# Patient Record
Sex: Female | Born: 1965 | Race: White | Hispanic: No | Marital: Married | Smoking: Never smoker
Health system: Southern US, Community
[De-identification: ages and names within clinical notes are randomized; demographics above are authoritative.]

## PROBLEM LIST (undated history)

## (undated) HISTORY — PX: ABDOMINAL HYSTERECTOMY: SHX81

## (undated) HISTORY — PX: OTHER SURGICAL HISTORY: SHX169

---

## 2014-01-03 ENCOUNTER — Ambulatory Visit (INDEPENDENT_AMBULATORY_CARE_PROVIDER_SITE_OTHER): Payer: 59

## 2014-01-03 VITALS — BP 131/77 | HR 74 | Resp 18

## 2014-01-03 DIAGNOSIS — M7732 Calcaneal spur, left foot: Secondary | ICD-10-CM

## 2014-01-03 DIAGNOSIS — M7730 Calcaneal spur, unspecified foot: Secondary | ICD-10-CM

## 2014-01-03 DIAGNOSIS — R52 Pain, unspecified: Secondary | ICD-10-CM

## 2014-01-03 DIAGNOSIS — M7660 Achilles tendinitis, unspecified leg: Secondary | ICD-10-CM

## 2014-01-03 MED ORDER — MELOXICAM 15 MG PO TABS: 15.0000 mg | ORAL_TABLET | Freq: Every day | ORAL | Status: AC

## 2014-01-03 NOTE — Patient Instructions (Signed)
ICE INSTRUCTIONS  Apply ice or cold pack to the affected area at least 3 times a day for 10-15 minutes each time.  You should also use ice after prolonged activity or vigorous exercise.  Do not apply ice longer than 20 minutes at one time.  Always keep a cloth between your skin and the ice pack to prevent burns.  Being consistent and following these instructions will help control your symptoms.  We suggest you purchase a gel ice pack because they are reusable and do bit leak.  Some of them are designed to wrap around the area.  Use the method that works best for you.  Here are some other suggestions for icing.   Use a frozen bag of peas or corn-inexpensive and molds well to your body, usually stays frozen for 10 to 20 minutes.  Wet a towel with cold water and squeeze out the excess until it's damp.  Place in a bag in the freezer for 20 minutes. Then remove and use.   Alternate applying a hot compresses such as a hot towel for 10 minutes and then the ice for 10 minutes repeat this 2 or 3 times at each session. Consider heat and ice middle of the day if possible and again at bedtime

## 2014-01-03 NOTE — Progress Notes (Signed)
   Subjective:    Patient ID: Nichole Mcgrath, female    DOB: 06/12/1965, 48 y.o.   MRN: [id number]  HPI THE BACK OF MY LEFT HEEL IS BOTHERING ME FOR ABOUT A WEEK NOW AND HURTS IN THE MORNING AND AT WORK DUE TO I WALK 8 TO 10 HOURS A DAY AND CAN'T HARDLY WALK AND DOES BURN AND THROB AND IS SORE AND TENDER AND THERE IS SOME SWELLING    Review of Systems  HENT: Positive for sinus pressure.   Musculoskeletal: Positive for gait problem.  Allergic/Immunologic: Positive for environmental allergies.  Neurological: Positive for headaches.  All other systems reviewed and are negative.      Objective:   Physical Exam 48 year old white female well-developed well-nourished oriented x3 presents this time with a history of pain posterior lateral heel varus and tenderness on palpation medial band of the plantar fascia hours the posterior heel Achilles tendon insertion is most significant patient as she remembers that she did some walking amusement park and starting the next day had pain could hardly walk in the morning and has had continued pain since then has tried icing and Tylenol or Advil for pain.  Lower extremity objective findings as follows vascular status is intact pedal pulses palpable DP and PT +2/4 bilateral capillary refill time 3 seconds epicritic and proprioceptive sensations intact and symmetric bilateral there is normal plantar response DTRs not elicited dermatologically skin color pigment normal hair growth absent nails unremarkable. Orthopedic exam rectus foot type ankle mid tarsal subtalar joint motions normal x-rays reveal well-developed inferior as well as retrocalcaneal spur within the tendon insertion there is some Achilles tendon thickening noted on lateral projections muscle fascial thickening noted no fractures no cysts lesions or tumors were identified on x-ray there is tenderness on dorsiflexion of the foot and ankle patient is currently wearing Crocs.       Assessment & Plan:    Assessment this time based on clinical  Findings is Achilles tendinitis/bursitis with retrocalcaneal spurring plan at this time rest and ice also warm compress ice pack to recommended elevating the heel wearing a wedge shoe or heel or crocs no barefoot no flimsy shoes or flip-flops and open back clog would be beneficial. Also at this time [facility] meloxicam once daily is prescribed to the patient's pharmacy followup in 2-3 weeks if no significant improvement moderate activities notable listed activities stay on level surfaces avoid going up or down hills or stairs whenever possible. Reappointed in 2-3 for reevaluation if no improvement consideration for mobilization the cam walker topical creams possibly the steroid. A steroid injection may be considered also for patient been some instances more invasive options including surgery could become an option if no improvement occurs and reevaluate with in 2-3 week  Alvan Dameichard Antonique Langford DPM

## 2014-01-24 ENCOUNTER — Ambulatory Visit: Payer: 59

## 2019-03-23 DIAGNOSIS — Z23 Encounter for immunization: Secondary | ICD-10-CM | POA: Diagnosis not present

## 2019-05-14 DIAGNOSIS — L4050 Arthropathic psoriasis, unspecified: Secondary | ICD-10-CM | POA: Diagnosis not present

## 2019-05-14 DIAGNOSIS — L400 Psoriasis vulgaris: Secondary | ICD-10-CM | POA: Diagnosis not present

## 2019-07-16 DIAGNOSIS — L400 Psoriasis vulgaris: Secondary | ICD-10-CM | POA: Diagnosis not present

## 2019-07-16 DIAGNOSIS — L299 Pruritus, unspecified: Secondary | ICD-10-CM | POA: Diagnosis not present

## 2019-09-14 DIAGNOSIS — R531 Weakness: Secondary | ICD-10-CM | POA: Diagnosis not present

## 2019-09-14 DIAGNOSIS — L400 Psoriasis vulgaris: Secondary | ICD-10-CM | POA: Diagnosis not present

## 2019-09-14 DIAGNOSIS — Z111 Encounter for screening for respiratory tuberculosis: Secondary | ICD-10-CM | POA: Diagnosis not present

## 2020-01-16 DIAGNOSIS — L301 Dyshidrosis [pompholyx]: Secondary | ICD-10-CM | POA: Diagnosis not present

## 2020-01-16 DIAGNOSIS — L4050 Arthropathic psoriasis, unspecified: Secondary | ICD-10-CM | POA: Diagnosis not present

## 2020-01-16 DIAGNOSIS — L400 Psoriasis vulgaris: Secondary | ICD-10-CM | POA: Diagnosis not present

## 2020-04-14 DIAGNOSIS — J209 Acute bronchitis, unspecified: Secondary | ICD-10-CM | POA: Diagnosis not present

## 2020-04-14 DIAGNOSIS — R0981 Nasal congestion: Secondary | ICD-10-CM | POA: Diagnosis not present

## 2021-02-06 DIAGNOSIS — R519 Headache, unspecified: Secondary | ICD-10-CM | POA: Diagnosis not present

## 2021-02-06 DIAGNOSIS — I10 Essential (primary) hypertension: Secondary | ICD-10-CM | POA: Diagnosis not present

## 2021-02-06 DIAGNOSIS — H9202 Otalgia, left ear: Secondary | ICD-10-CM | POA: Diagnosis not present

## 2021-02-06 DIAGNOSIS — H9201 Otalgia, right ear: Secondary | ICD-10-CM | POA: Diagnosis not present

## 2021-02-11 DIAGNOSIS — I160 Hypertensive urgency: Secondary | ICD-10-CM | POA: Diagnosis not present

## 2021-02-11 DIAGNOSIS — Z20822 Contact with and (suspected) exposure to covid-19: Secondary | ICD-10-CM | POA: Diagnosis not present

## 2021-02-11 DIAGNOSIS — E782 Mixed hyperlipidemia: Secondary | ICD-10-CM | POA: Diagnosis not present

## 2021-02-11 DIAGNOSIS — R519 Headache, unspecified: Secondary | ICD-10-CM | POA: Diagnosis not present

## 2021-02-11 DIAGNOSIS — Z7689 Persons encountering health services in other specified circumstances: Secondary | ICD-10-CM | POA: Diagnosis not present

## 2021-02-11 DIAGNOSIS — I10 Essential (primary) hypertension: Secondary | ICD-10-CM | POA: Diagnosis not present

## 2021-02-11 DIAGNOSIS — F17210 Nicotine dependence, cigarettes, uncomplicated: Secondary | ICD-10-CM | POA: Diagnosis not present

## 2021-02-16 DIAGNOSIS — I10 Essential (primary) hypertension: Secondary | ICD-10-CM | POA: Diagnosis not present

## 2021-02-16 DIAGNOSIS — Z23 Encounter for immunization: Secondary | ICD-10-CM | POA: Diagnosis not present

## 2021-02-16 DIAGNOSIS — E782 Mixed hyperlipidemia: Secondary | ICD-10-CM | POA: Diagnosis not present

## 2021-02-17 DIAGNOSIS — I10 Essential (primary) hypertension: Secondary | ICD-10-CM | POA: Diagnosis not present

## 2021-02-19 DIAGNOSIS — Z23 Encounter for immunization: Secondary | ICD-10-CM | POA: Diagnosis not present

## 2021-02-23 DIAGNOSIS — I10 Essential (primary) hypertension: Secondary | ICD-10-CM | POA: Diagnosis not present

## 2021-02-23 DIAGNOSIS — N289 Disorder of kidney and ureter, unspecified: Secondary | ICD-10-CM | POA: Diagnosis not present

## 2021-02-23 DIAGNOSIS — Z6835 Body mass index (BMI) 35.0-35.9, adult: Secondary | ICD-10-CM | POA: Diagnosis not present

## 2021-03-11 DIAGNOSIS — E782 Mixed hyperlipidemia: Secondary | ICD-10-CM | POA: Diagnosis not present

## 2021-03-18 DIAGNOSIS — R300 Dysuria: Secondary | ICD-10-CM | POA: Diagnosis not present

## 2021-03-18 DIAGNOSIS — N289 Disorder of kidney and ureter, unspecified: Secondary | ICD-10-CM | POA: Diagnosis not present

## 2021-03-18 DIAGNOSIS — E782 Mixed hyperlipidemia: Secondary | ICD-10-CM | POA: Diagnosis not present

## 2021-03-18 DIAGNOSIS — I10 Essential (primary) hypertension: Secondary | ICD-10-CM | POA: Diagnosis not present

## 2021-03-25 DIAGNOSIS — Z6834 Body mass index (BMI) 34.0-34.9, adult: Secondary | ICD-10-CM | POA: Diagnosis not present

## 2021-03-25 DIAGNOSIS — I10 Essential (primary) hypertension: Secondary | ICD-10-CM | POA: Diagnosis not present

## 2021-04-01 DIAGNOSIS — Z0000 Encounter for general adult medical examination without abnormal findings: Secondary | ICD-10-CM | POA: Diagnosis not present

## 2021-04-01 DIAGNOSIS — F331 Major depressive disorder, recurrent, moderate: Secondary | ICD-10-CM | POA: Diagnosis not present

## 2021-04-01 DIAGNOSIS — I10 Essential (primary) hypertension: Secondary | ICD-10-CM | POA: Diagnosis not present

## 2021-04-01 DIAGNOSIS — Z1331 Encounter for screening for depression: Secondary | ICD-10-CM | POA: Diagnosis not present

## 2021-04-01 DIAGNOSIS — F17200 Nicotine dependence, unspecified, uncomplicated: Secondary | ICD-10-CM | POA: Diagnosis not present

## 2021-04-13 DIAGNOSIS — J020 Streptococcal pharyngitis: Secondary | ICD-10-CM | POA: Diagnosis not present

## 2021-04-13 DIAGNOSIS — R059 Cough, unspecified: Secondary | ICD-10-CM | POA: Diagnosis not present

## 2021-04-13 DIAGNOSIS — Z20822 Contact with and (suspected) exposure to covid-19: Secondary | ICD-10-CM | POA: Diagnosis not present

## 2021-04-20 DIAGNOSIS — J029 Acute pharyngitis, unspecified: Secondary | ICD-10-CM | POA: Diagnosis not present

## 2021-04-20 DIAGNOSIS — Z20822 Contact with and (suspected) exposure to covid-19: Secondary | ICD-10-CM | POA: Diagnosis not present

## 2021-04-20 DIAGNOSIS — R059 Cough, unspecified: Secondary | ICD-10-CM | POA: Diagnosis not present

## 2021-05-14 DIAGNOSIS — Z20828 Contact with and (suspected) exposure to other viral communicable diseases: Secondary | ICD-10-CM | POA: Diagnosis not present

## 2021-05-14 DIAGNOSIS — R0602 Shortness of breath: Secondary | ICD-10-CM | POA: Diagnosis not present

## 2021-05-14 DIAGNOSIS — R5382 Chronic fatigue, unspecified: Secondary | ICD-10-CM | POA: Diagnosis not present

## 2021-05-14 DIAGNOSIS — R051 Acute cough: Secondary | ICD-10-CM | POA: Diagnosis not present

## 2021-06-10 DIAGNOSIS — F17210 Nicotine dependence, cigarettes, uncomplicated: Secondary | ICD-10-CM | POA: Diagnosis not present

## 2021-06-10 DIAGNOSIS — Z122 Encounter for screening for malignant neoplasm of respiratory organs: Secondary | ICD-10-CM | POA: Diagnosis not present

## 2021-06-10 DIAGNOSIS — Z87891 Personal history of nicotine dependence: Secondary | ICD-10-CM | POA: Diagnosis not present

## 2021-09-16 ENCOUNTER — Other Ambulatory Visit: Payer: Self-pay | Admitting: Physician Assistant

## 2021-09-16 DIAGNOSIS — Z1231 Encounter for screening mammogram for malignant neoplasm of breast: Secondary | ICD-10-CM

## 2021-11-02 ENCOUNTER — Ambulatory Visit
Admission: RE | Admit: 2021-11-02 | Discharge: 2021-11-02 | Disposition: A | Discharge status: Home / Self Care | Payer: No Typology Code available for payment source | Source: Ambulatory Visit | Attending: Physician Assistant | Admitting: Physician Assistant

## 2021-11-02 DIAGNOSIS — Z1231 Encounter for screening mammogram for malignant neoplasm of breast: Secondary | ICD-10-CM

## 2021-11-18 DIAGNOSIS — R600 Localized edema: Secondary | ICD-10-CM | POA: Diagnosis not present

## 2021-12-28 DIAGNOSIS — R079 Chest pain, unspecified: Secondary | ICD-10-CM

## 2023-01-12 ENCOUNTER — Other Ambulatory Visit: Payer: Self-pay | Admitting: Physician Assistant

## 2023-01-12 DIAGNOSIS — Z1231 Encounter for screening mammogram for malignant neoplasm of breast: Secondary | ICD-10-CM

## 2023-01-12 IMAGING — MG MM DIGITAL SCREENING BILAT W/ TOMO AND CAD
8 series · 8 of 24 positions shown · non-contrast
Comparison: Previous exam(s).

CLINICAL DATA: Screening.

EXAM:
DIGITAL SCREENING BILATERAL MAMMOGRAM WITH TOMOSYNTHESIS AND CAD
TECHNIQUE: Bilateral screening digital craniocaudal and mediolateral oblique
mammograms were obtained. Bilateral screening digital breast
tomosynthesis was performed. The images were evaluated with
computer-aided detection.

[R CC synth-2D]
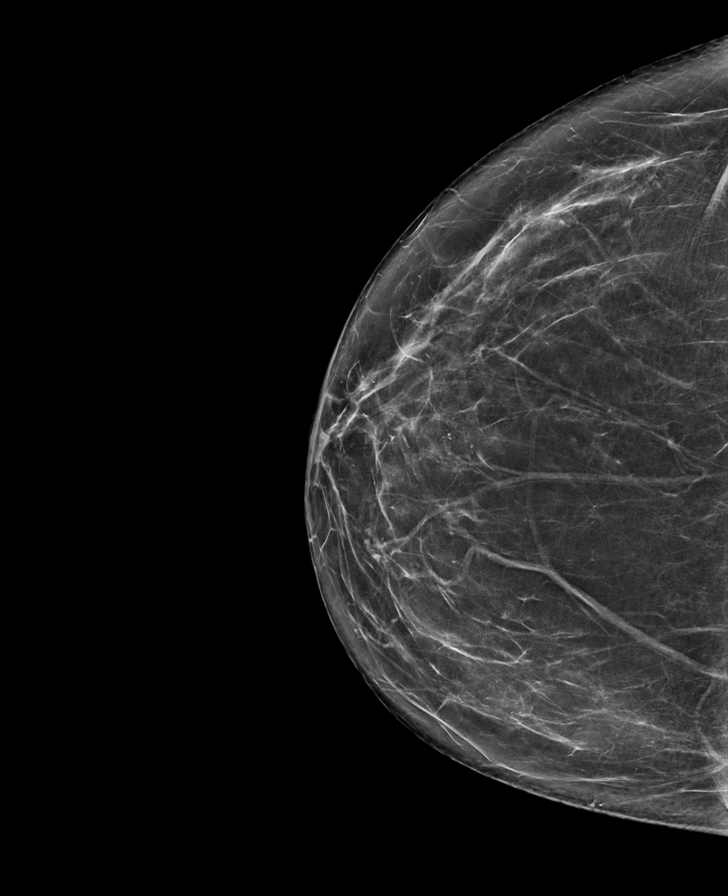

[L MLO synth-2D]
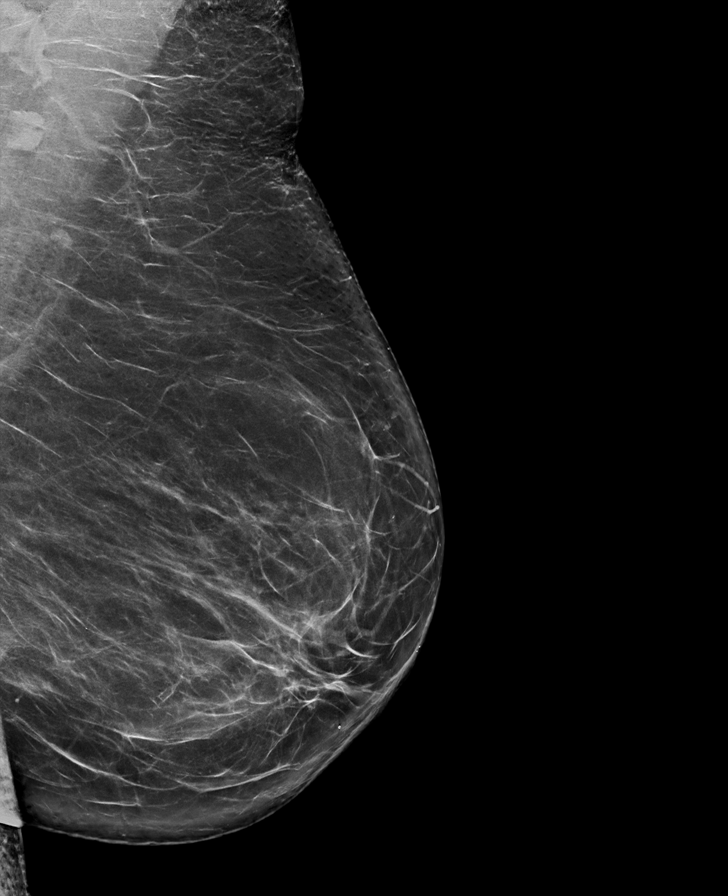

[L CC synth-2D]
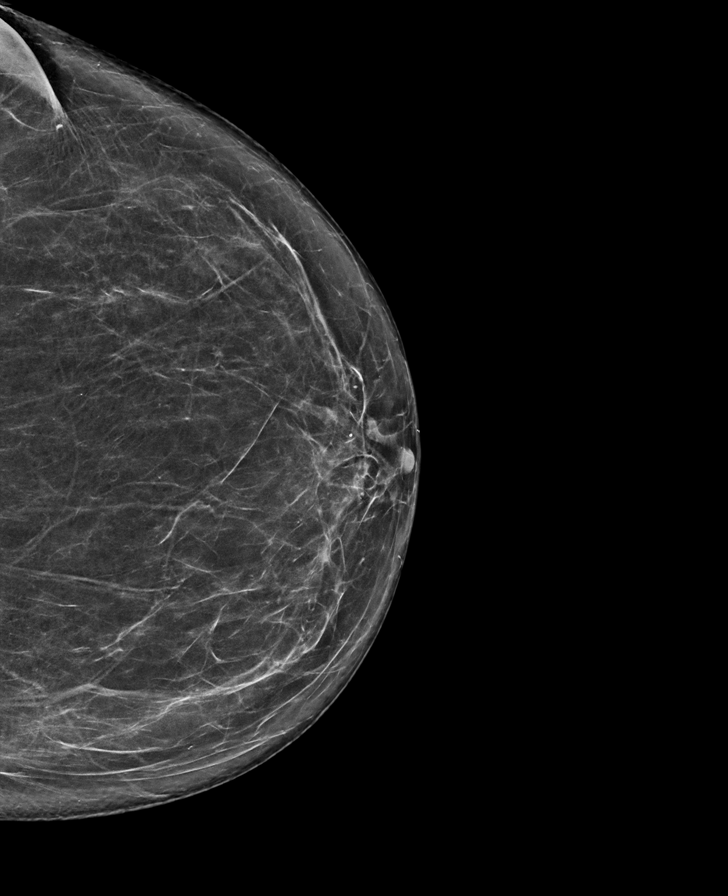

[R MLO synth-2D]
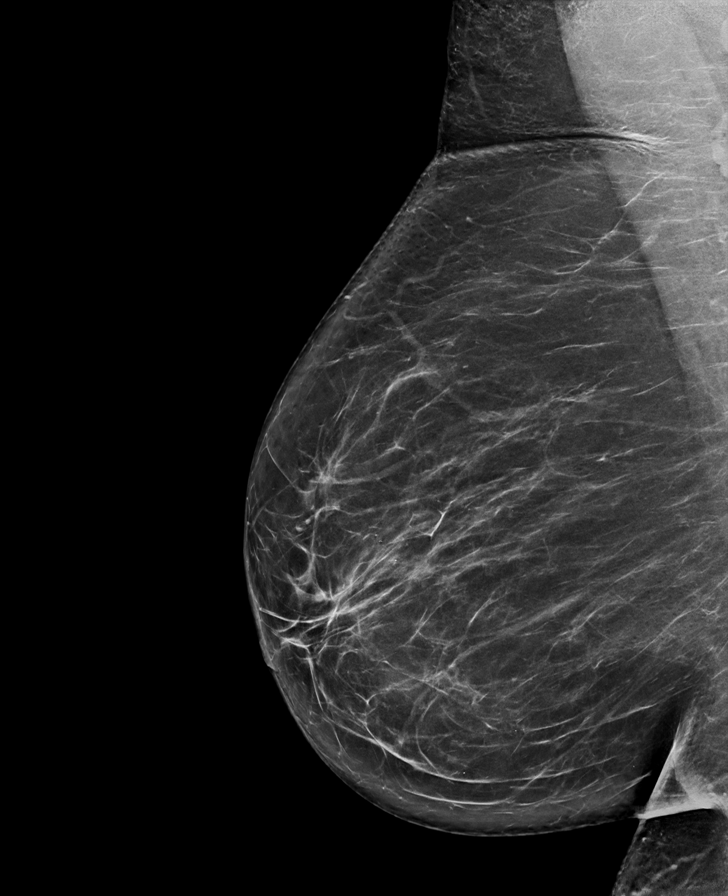

[R MLO tomo · tomo slice 43/86.0]
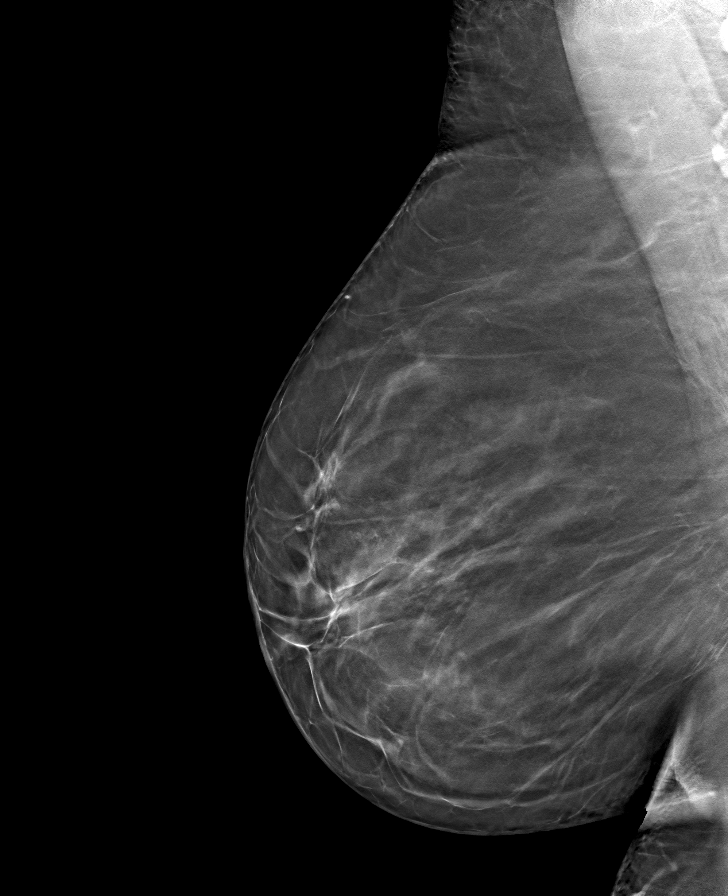

[L CC tomo · tomo slice 39/77.0]
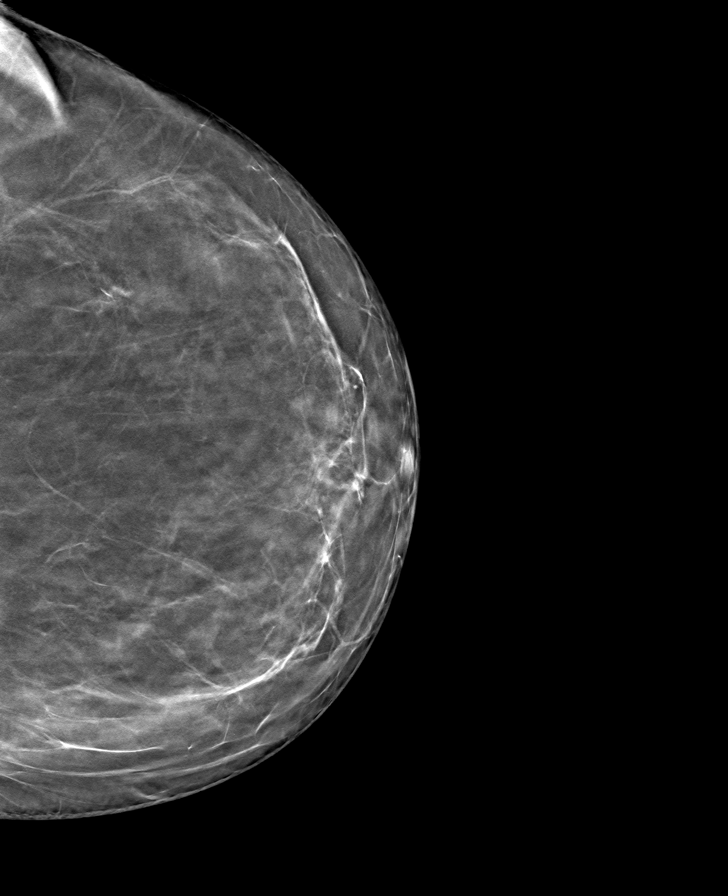

[R CC tomo · tomo slice 39/78.0]
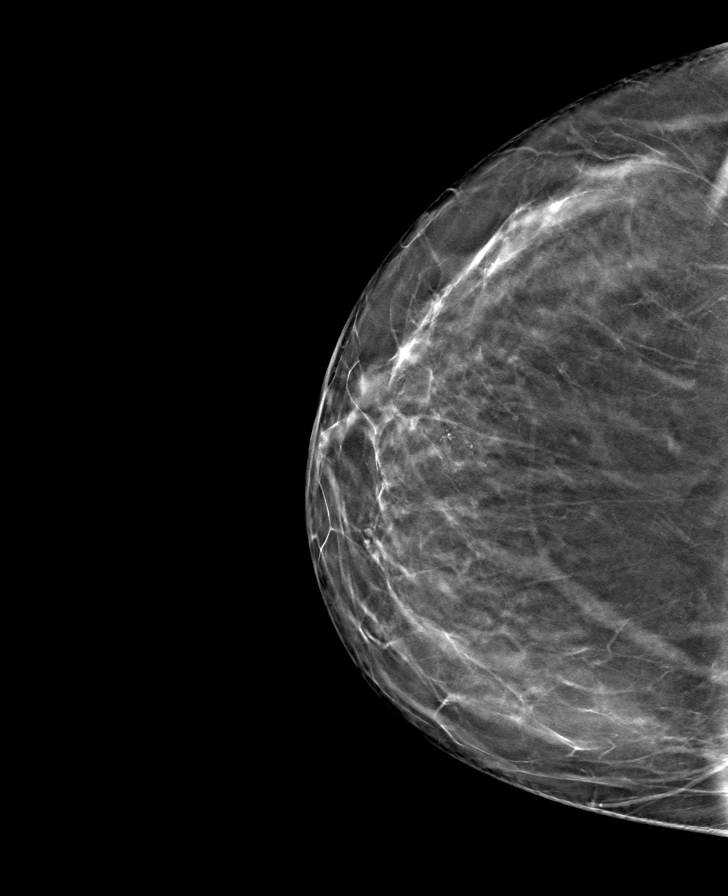

[L MLO tomo · tomo slice 45/88.0]
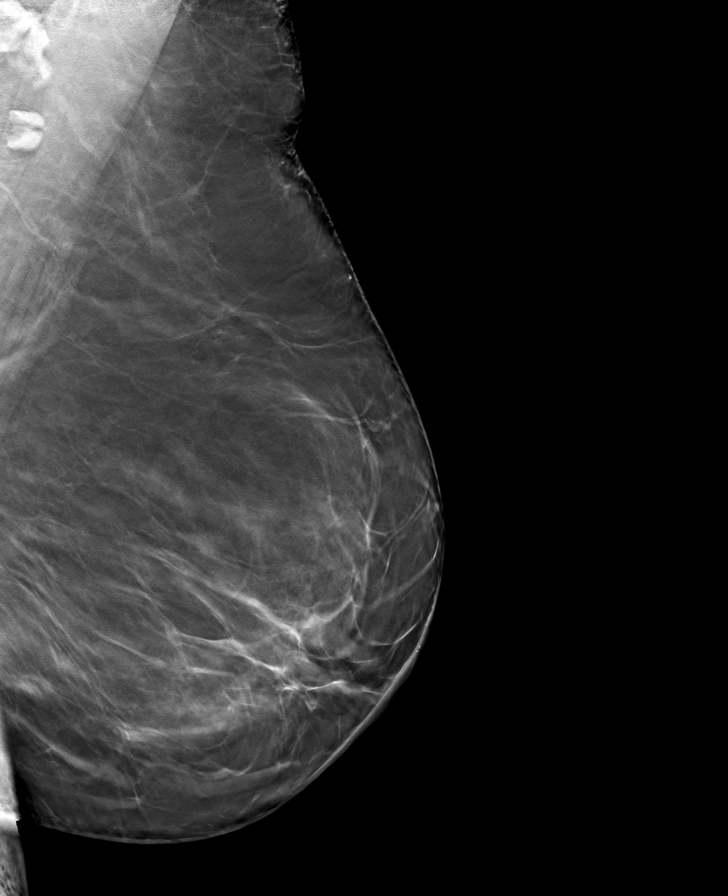

[8 of 24 positions shown; findings below may reference images not displayed]

ACR Breast Density Category b: There are scattered areas of
fibroglandular density.
FINDINGS: There are no findings suspicious for malignancy.
IMPRESSION: No mammographic evidence of malignancy. A result letter of this
screening mammogram will be mailed directly to the patient.

RECOMMENDATION:
Screening mammogram in one year. (Code:51-O-LD2)

BI-RADS CATEGORY  1: Negative.

## 2023-02-02 ENCOUNTER — Ambulatory Visit
Admission: RE | Admit: 2023-02-02 | Discharge: 2023-02-02 | Disposition: A | Discharge status: Home / Self Care | Payer: Managed Care, Other (non HMO) | Source: Ambulatory Visit | Attending: Physician Assistant | Admitting: Physician Assistant

## 2023-02-02 DIAGNOSIS — Z1231 Encounter for screening mammogram for malignant neoplasm of breast: Secondary | ICD-10-CM
